# Patient Record
Sex: Male | Born: 2000 | Race: White | Hispanic: Yes | Marital: Single | Smoking: Never smoker
Health system: Southern US, Community
[De-identification: ages and names within clinical notes are randomized; demographics above are authoritative.]

## PROBLEM LIST (undated history)

## (undated) DIAGNOSIS — F913 Oppositional defiant disorder: Secondary | ICD-10-CM

## (undated) DIAGNOSIS — R45851 Suicidal ideations: Secondary | ICD-10-CM

## (undated) DIAGNOSIS — Z789 Other specified health status: Secondary | ICD-10-CM

## (undated) DIAGNOSIS — F329 Major depressive disorder, single episode, unspecified: Secondary | ICD-10-CM

## (undated) HISTORY — PX: NO PAST SURGERIES: SHX2092

---
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
